# Patient Record
Sex: Male | Born: 1995 | Race: White | Hispanic: No | Marital: Single | Smoking: Never smoker
Health system: Southern US, Community
[De-identification: ages and names within clinical notes are randomized; demographics above are authoritative.]

---
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
## 2014-12-21 ENCOUNTER — Encounter (HOSPITAL_BASED_OUTPATIENT_CLINIC_OR_DEPARTMENT_OTHER): Payer: Self-pay | Admitting: Emergency Medicine

## 2014-12-21 DIAGNOSIS — Y9289 Other specified places as the place of occurrence of the external cause: Secondary | ICD-10-CM | POA: Diagnosis not present

## 2014-12-21 DIAGNOSIS — W01198A Fall on same level from slipping, tripping and stumbling with subsequent striking against other object, initial encounter: Secondary | ICD-10-CM | POA: Insufficient documentation

## 2014-12-21 DIAGNOSIS — Y998 Other external cause status: Secondary | ICD-10-CM | POA: Insufficient documentation

## 2014-12-21 DIAGNOSIS — I10 Essential (primary) hypertension: Secondary | ICD-10-CM | POA: Insufficient documentation

## 2014-12-21 DIAGNOSIS — S0121XA Laceration without foreign body of nose, initial encounter: Secondary | ICD-10-CM | POA: Diagnosis present

## 2014-12-21 DIAGNOSIS — W172XXA Fall into hole, initial encounter: Secondary | ICD-10-CM | POA: Insufficient documentation

## 2014-12-21 DIAGNOSIS — Y9389 Activity, other specified: Secondary | ICD-10-CM | POA: Insufficient documentation

## 2014-12-21 NOTE — ED Notes (Signed)
Pt he cut nose on something

## 2014-12-22 ENCOUNTER — Emergency Department (HOSPITAL_BASED_OUTPATIENT_CLINIC_OR_DEPARTMENT_OTHER)
Admission: EM | Admit: 2014-12-22 | Discharge: 2014-12-22 | Disposition: A | Discharge status: Home / Self Care | Payer: Medicaid Other | Attending: Emergency Medicine | Admitting: Emergency Medicine

## 2014-12-22 ENCOUNTER — Encounter (HOSPITAL_BASED_OUTPATIENT_CLINIC_OR_DEPARTMENT_OTHER): Payer: Self-pay | Admitting: Emergency Medicine

## 2014-12-22 DIAGNOSIS — S0181XA Laceration without foreign body of other part of head, initial encounter: Secondary | ICD-10-CM

## 2014-12-22 NOTE — ED Notes (Signed)
Fell into metal fire pit, nasal bridge lac, denies: LOC, head neck back, dental or facial pain, denies malocclusion or visual changes, external bleeding controlled,  No epistaxis noted. Alert, NAD, calm, interactive.

## 2014-12-22 NOTE — ED Provider Notes (Signed)
CSN: [id number]     Arrival date & time 12/21/14  2158 History  This chart was scribed for Sadiya Durand, MD by Roxy Cedarhandni Bhalodia, ED Scribe. This patient was seen in room MH03/MH03 and the patient's care was started at 12:47 AM.   Chief Complaint  Patient presents with  . Facial Laceration   Patient is a 19 y.o. male presenting with skin laceration. The history is provided by the patient. No language interpreter was used.  Laceration Location:  Face Facial laceration location:  Nose Depth:  Cutaneous Quality: straight   Bleeding: controlled   Injury mechanism: bumped nose on chimney. Pain details:    Quality:  Aching   Severity:  Moderate   Timing:  Constant   Progression:  Unchanged Foreign body present:  No foreign bodies Relieved by:  None tried Worsened by:  Nothing tried Ineffective treatments:  None tried Tetanus status:  Up to date  HPI Comments: Jerome Walker is a 19 y.o. male with a PMHx of hypertension, who presents to the Emergency Department complaining of moderate laceration to bridge of nose onset earlier today when he hit it against a fire pit chimney.  History reviewed. No pertinent past medical history. History reviewed. No pertinent past surgical history. Family History  Problem Relation Age of Onset  . Hypertension Father    History  Substance Use Topics  . Smoking status: Never Smoker   . Smokeless tobacco: Not on file  . Alcohol Use: No   Review of Systems  Skin: Positive for wound.  All other systems reviewed and are negative.  Allergies  Review of patient's allergies indicates no known allergies.  Home Medications   Prior to Admission medications   Not on File   Triage Vitals: BP 123/75 mmHg  Pulse 73  Temp(Src) 98.2 F (36.8 C)  Resp 18  Ht 5\' 11"  (1.803 m)  Wt 156 lb (70.761 kg)  BMI 21.77 kg/m2  SpO2 99%  Physical Exam  Constitutional: He is oriented to person, place, and time. He appears well-developed and well-nourished.   HENT:  Head: Normocephalic and atraumatic.  Mouth/Throat: Oropharynx is clear and moist.  1.25 superficial laceration to bridge of nose. No septal bleeding.  Eyes: Pupils are equal, round, and reactive to light.  No battle sign. No raccoon eyes.  Neck: Normal range of motion. Neck supple.  Cardiovascular: Normal rate, regular rhythm and normal heart sounds.   Pulmonary/Chest: Effort normal and breath sounds normal. No respiratory distress. He has no wheezes. He has no rales.  Abdominal: Soft. Bowel sounds are normal. There is no tenderness.  Musculoskeletal: Normal range of motion.  Neurological: He is alert and oriented to person, place, and time.  Skin: Skin is warm and dry.  Psychiatric: He has a normal mood and affect. His behavior is normal.  Nursing note and vitals reviewed.  ED Course  Procedures (including critical care time)  LACERATION REPAIR Performed by: Delbert Darley Consent: Verbal consent obtained. Risks and benefits: risks, benefits and alternatives were discussed Patient identity confirmed: provided demographic data Time out performed prior to procedure Prepped and Draped in normal sterile fashion Wound explored Laceration Location: nose Laceration Length:1.25cm No Foreign Bodies seen or palpated Anesthesia: N/A Local anesthetic: None Anesthetic total: None used Irrigation method: syringe Amount of cleaning: standard Skin closure: Derma bond Number of sutures or staples: None Technique: Derma bond Patient tolerance: Patient tolerated the procedure well with no immediate complications.  DIAGNOSTIC STUDIES: Oxygen Saturation is 99% on RA, normal  by my interpretation.    COORDINATION OF CARE: 12:50 AM- Discussed plans to apply derma bond to affected area. Pt advised of plan for treatment and pt agrees.  Labs Review Labs Reviewed - No data to display  Imaging Review No results found.   EKG Interpretation None     MDM   Final diagnoses:  None    LACERATION REPAIR Performed by: Jasmine AwePALUMBO-RASCH,Anira Senegal K Authorized by: Jasmine AwePALUMBO-RASCH,Lewanda Perea K Consent: Verbal consent obtained. Risks and benefits: risks, benefits and alternatives were discussed Consent given by: patient Patient identity confirmed: provided demographic data Prepped and Draped in normal sterile fashion Wound explored  Laceration Location: nose  Laceration Length: 1.25 cm  No Foreign Bodies seen or palpated  Irrigation method: syringe Amount of cleaning: standard  Skin closure: dermabond   Patient tolerance: Patient tolerated the procedure well with no immediate complications.  I personally performed the services described in this documentation, which was scribed in my presence. The recorded information has been reviewed and is accurate.    Cy BlamerApril Alesana Magistro, MD 12/22/14 [phone]

## 2014-12-22 NOTE — ED Notes (Addendum)
Wound approximated well, dermabond intact, no active bleeding, no epistaxis noted, denies pain, "ready to go".

## 2014-12-22 NOTE — Discharge Instructions (Signed)
Facial Laceration °A facial laceration is a cut on the face. These injuries can be painful and cause bleeding. Some cuts may need to be closed with stitches (sutures), skin adhesive strips, or wound glue. Cuts usually heal quickly but can leave a scar. It can take 1-2 years for the scar to go away completely. °HOME CARE  °· Only take medicines as told by your doctor. °· Follow your doctor's instructions for wound care. °For Stitches: °· Keep the cut clean and dry. °· If you have a bandage (dressing), change it at least once a day. Change the bandage if it gets wet or dirty, or as told by your doctor. °· Wash the cut with soap and water 2 times a day. Rinse the cut with water. Pat it dry with a clean towel. °· Put a thin layer of medicated cream on the cut as told by your doctor. °· You may shower after the first 24 hours. Do not soak the cut in water until the stitches are removed. °· Have your stitches removed as told by your doctor. °· Do not wear any makeup until a few days after your stitches are removed. °For Skin Adhesive Strips: °· Keep the cut clean and dry. °· Do not get the strips wet. You may take a bath, but be careful to keep the cut dry. °· If the cut gets wet, pat it dry with a clean towel. °· The strips will fall off on their own. Do not remove the strips that are still stuck to the cut. °For Wound Glue: °· You may shower or take baths. Do not soak or scrub the cut. Do not swim. Avoid heavy sweating until the glue falls off on its own. After a shower or bath, pat the cut dry with a clean towel. °· Do not put medicine or makeup on your cut until the glue falls off. °· If you have a bandage, do not put tape over the glue. °· Avoid lots of sunlight or tanning lamps until the glue falls off. °· The glue will fall off on its own in 5-10 days. Do not pick at the glue. °After Healing: °Put sunscreen on the cut for the first year to reduce your scar. °GET HELP RIGHT AWAY IF:  °· Your cut area gets red,  painful, or puffy (swollen). °· You see a yellowish-white fluid (pus) coming from the cut. °· You have chills or a fever. °MAKE SURE YOU:  °· Understand these instructions. °· Will watch your condition. °· Will get help right away if you are not doing well or get worse. °Document Released: 01/18/2008 Document Revised: 05/22/2013 Document Reviewed: 03/14/2013 °[employer]® Patient Information ©2015 [employer]. This information is not intended to replace advice given to you by your health care provider. Make sure you discuss any questions you have with your health care provider. ° °

## 2016-08-10 ENCOUNTER — Emergency Department (HOSPITAL_COMMUNITY)
Admission: EM | Admit: 2016-08-10 | Discharge: 2016-08-10 | Disposition: A | Discharge status: Home / Self Care | Payer: BLUE CROSS/BLUE SHIELD | Attending: Emergency Medicine | Admitting: Emergency Medicine

## 2016-08-10 ENCOUNTER — Emergency Department (HOSPITAL_COMMUNITY): Payer: BLUE CROSS/BLUE SHIELD

## 2016-08-10 ENCOUNTER — Encounter (HOSPITAL_COMMUNITY): Payer: Self-pay | Admitting: Emergency Medicine

## 2016-08-10 DIAGNOSIS — Y939 Activity, unspecified: Secondary | ICD-10-CM | POA: Insufficient documentation

## 2016-08-10 DIAGNOSIS — S139XXA Sprain of joints and ligaments of unspecified parts of neck, initial encounter: Secondary | ICD-10-CM | POA: Insufficient documentation

## 2016-08-10 DIAGNOSIS — S0093XA Contusion of unspecified part of head, initial encounter: Secondary | ICD-10-CM

## 2016-08-10 DIAGNOSIS — S022XXA Fracture of nasal bones, initial encounter for closed fracture: Secondary | ICD-10-CM | POA: Insufficient documentation

## 2016-08-10 DIAGNOSIS — Y92410 Unspecified street and highway as the place of occurrence of the external cause: Secondary | ICD-10-CM | POA: Insufficient documentation

## 2016-08-10 DIAGNOSIS — S01512A Laceration without foreign body of oral cavity, initial encounter: Secondary | ICD-10-CM | POA: Diagnosis not present

## 2016-08-10 DIAGNOSIS — Y999 Unspecified external cause status: Secondary | ICD-10-CM | POA: Diagnosis not present

## 2016-08-10 DIAGNOSIS — S0992XA Unspecified injury of nose, initial encounter: Secondary | ICD-10-CM | POA: Diagnosis present

## 2016-08-10 MED ORDER — METHOCARBAMOL 500 MG PO TABS: 500.0000 mg | ORAL_TABLET | Freq: Two times a day (BID) | ORAL | 0 refills | Status: AC

## 2016-08-10 MED ORDER — NAPROXEN 375 MG PO TABS: 375.0000 mg | ORAL_TABLET | Freq: Two times a day (BID) | ORAL | 0 refills | Status: AC

## 2016-08-10 MED ORDER — HYDROCODONE-ACETAMINOPHEN 5-325 MG PO TABS: 1.0000 | ORAL_TABLET | Freq: Once | ORAL | Status: DC

## 2016-08-10 MED ORDER — BACITRACIN ZINC 500 UNIT/GM EX OINT: TOPICAL_OINTMENT | Freq: Two times a day (BID) | CUTANEOUS | Status: DC

## 2016-08-10 MED ORDER — AMOXICILLIN 250 MG PO CAPS: 250.0000 mg | ORAL_CAPSULE | Freq: Three times a day (TID) | ORAL | 0 refills | Status: AC

## 2016-08-10 NOTE — Discharge Instructions (Signed)
Your CAT scans showed nasal fracture. Your head CT was normal. Your neck CAT scan was normal. He did have soft tissue swelling of the right lower lip. He may take the naproxen as needed for pain. He was taking the Robaxin for muscle relaxers. This will make you sleepy so do not drive. I have also given a prescription for some amoxicillin to take for 4 days to cover for prophylactic treatment of your mouth wound. Please ice your nose and right lower lip. Do not blow your nose. You may use saline nasal spray to moisten your nose. Please follow-up with the ears nose and throat doctor I have given your referral. Return to the ED if your symptoms worsen. He might have symptoms of mild concussion. Please get plenty of mental and physical rest. Follow-up with primary care doctor.
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
## 2019-05-07 ENCOUNTER — Other Ambulatory Visit: Payer: Self-pay

## 2019-05-07 DIAGNOSIS — Z20822 Contact with and (suspected) exposure to covid-19: Secondary | ICD-10-CM

## 2019-05-09 ENCOUNTER — Other Ambulatory Visit: Payer: Self-pay

## 2019-05-09 DIAGNOSIS — Z20822 Contact with and (suspected) exposure to covid-19: Secondary | ICD-10-CM

## 2019-05-09 LAB — NOVEL CORONAVIRUS, NAA: SARS-CoV-2, NAA: NOT DETECTED

## 2019-05-11 LAB — NOVEL CORONAVIRUS, NAA: SARS-CoV-2, NAA: NOT DETECTED
